# Patient Record
Sex: Male | Born: 1990 | Race: Black or African American | Hispanic: No | Marital: Single | Smoking: Never smoker
Health system: Southern US, Community
[De-identification: ages and names within clinical notes are randomized; demographics above are authoritative.]

---

## 2013-11-01 ENCOUNTER — Emergency Department (HOSPITAL_COMMUNITY): Payer: Self-pay

## 2013-11-01 ENCOUNTER — Encounter (HOSPITAL_COMMUNITY): Payer: Self-pay | Admitting: Emergency Medicine

## 2013-11-01 ENCOUNTER — Emergency Department (HOSPITAL_COMMUNITY)
Admission: EM | Admit: 2013-11-01 | Discharge: 2013-11-01 | Disposition: A | Discharge status: Home / Self Care | Payer: Self-pay | Attending: Emergency Medicine | Admitting: Emergency Medicine

## 2013-11-01 DIAGNOSIS — S46909A Unspecified injury of unspecified muscle, fascia and tendon at shoulder and upper arm level, unspecified arm, initial encounter: Secondary | ICD-10-CM | POA: Insufficient documentation

## 2013-11-01 DIAGNOSIS — S4980XA Other specified injuries of shoulder and upper arm, unspecified arm, initial encounter: Secondary | ICD-10-CM | POA: Insufficient documentation

## 2013-11-01 DIAGNOSIS — Y9361 Activity, american tackle football: Secondary | ICD-10-CM | POA: Insufficient documentation

## 2013-11-01 DIAGNOSIS — M25512 Pain in left shoulder: Secondary | ICD-10-CM

## 2013-11-01 DIAGNOSIS — X500XXA Overexertion from strenuous movement or load, initial encounter: Secondary | ICD-10-CM | POA: Insufficient documentation

## 2013-11-01 DIAGNOSIS — Y92838 Other recreation area as the place of occurrence of the external cause: Secondary | ICD-10-CM

## 2013-11-01 DIAGNOSIS — Y9239 Other specified sports and athletic area as the place of occurrence of the external cause: Secondary | ICD-10-CM | POA: Insufficient documentation

## 2013-11-01 MED ORDER — HYDROCODONE-ACETAMINOPHEN 5-325 MG PO TABS: 1.0000 | ORAL_TABLET | ORAL | Status: DC | PRN

## 2013-11-01 MED ORDER — IBUPROFEN 800 MG PO TABS: 800.0000 mg | ORAL_TABLET | Freq: Three times a day (TID) | ORAL | Status: DC

## 2013-11-01 NOTE — ED Provider Notes (Signed)
CSN: [id number]     Arrival date & time 11/01/13  1438 History  This chart was scribed for a non-physician practitioner, Melvenia Beam A. Junius Finner, working with Suzi Roots, MD by [place] Peace, ED Scribe. The patient was seen in WTR6/WTR6. The patient's care was started at 4:38 PM.    Chief Complaint  Patient presents with  . Shoulder Pain    Patient is a 23 y.o. male presenting with shoulder pain. The history is provided by the patient. No language interpreter was used.  Shoulder Pain This is a new problem. The current episode started more than 1 week ago. The problem has not changed since onset.Pertinent negatives include no chest pain, no abdominal pain, no headaches and no shortness of breath.   HPI Comments: Steve Ferrell is a 23 y.o. male who presents to the Emergency Department complaining of left shoulder pain onset 1 month ago that occurred while playing football. 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 He states pain is exacerbated with any rotation or movement of his shoulder. Pt reports that since injury he has noticed his shoulder popping in and out of place and believes he may have torn something. He reports that he has been taking Ibuprofen to address pain.    History reviewed. No pertinent past medical history. History reviewed. No pertinent past surgical history. No family history on file. History  Substance Use Topics  . Smoking status: Never Smoker   . Smokeless tobacco: Not on file  . Alcohol Use: No    Review of Systems  Constitutional: Negative for fever and chills.  Respiratory: Negative for shortness of breath.   Cardiovascular: Negative for chest pain.  Gastrointestinal: Negative for nausea, vomiting, abdominal pain and diarrhea.  Musculoskeletal:       Left shoulder pain.   Neurological: Positive for numbness. Negative for  headaches.  All other systems reviewed and are negative.     Allergies  Review of patient's allergies indicates no known allergies.  Home Medications   Prior to Admission medications   Not on File   BP 146/81  Pulse 65  Temp(Src) 97.5 F (36.4 C) (Oral)  Resp 17  SpO2 100% Physical Exam  Nursing note and vitals reviewed. Constitutional: He is oriented to person, place, and time. He appears well-developed and well-nourished. No distress.  HENT:  Head: Normocephalic and atraumatic.  Eyes: Conjunctivae and EOM are normal.  Neck: Neck supple. No tracheal deviation present.  Cardiovascular: Normal rate.   DP's intact.   Pulmonary/Chest: Effort normal. No respiratory distress.  Musculoskeletal: Normal range of motion.  No midline cervical tenderness. Tenderness of superior most left shoulder without any deformity which may be limited by body habitus. Full ROM of LUE.   Neurological: He is alert and oriented to person, place, and time.  Skin: Skin is warm and dry.  Psychiatric: He has a normal mood and affect. His behavior is normal.    ED Course  Procedures (including critical care time) Labs Review Labs Reviewed - No data to display  No results found for this or any previous visit. No results found.  Dg Shoulder Left  11/01/2013   CLINICAL DATA:  Pain.  EXAM: LEFT SHOULDER - 2+ VIEW  COMPARISON:  None.  FINDINGS: There is no evidence of fracture or dislocation. There is no evidence of arthropathy or other focal bone  abnormality. Soft tissues are unremarkable.  IMPRESSION: Negative.   Electronically Signed   By: Maisie Fus  Register   On: 11/01/2013 17:10    Imaging Review No results found.   EKG Interpretation None     Medications - No data to display  4:43 PM- Treatment plan was discussed with patient who verbalizes understanding and agrees.   MDM   Final diagnoses:  None    1. Left shoulder pain  X-ray negative for separation or other abnormality. Refer to  ortho for further management.  I personally performed the services described in this documentation, which was scribed in my presence. The recorded information has been reviewed and is accurate.     Arnoldo Hooker, PA-C 11/01/13 1746

## 2013-11-01 NOTE — Discharge Instructions (Signed)
Arthralgia °Your caregiver has diagnosed you as suffering from an arthralgia. Arthralgia means there is pain in a joint. This can come from many reasons including: °· Bruising the joint which causes soreness (inflammation) in the joint. °· Wear and tear on the joints which occur as we grow older (osteoarthritis). °· Overusing the joint. °· Various forms of arthritis. °· Infections of the joint. °Regardless of the cause of pain in your joint, most of these different pains respond to anti-inflammatory drugs and rest. The exception to this is when a joint is infected, and these cases are treated with antibiotics, if it is a bacterial infection. °HOME CARE INSTRUCTIONS  °· Rest the injured area for as long as directed by your caregiver. Then slowly start using the joint as directed by your caregiver and as the pain allows. Crutches as directed may be useful if the ankles, knees or hips are involved. If the knee was splinted or casted, continue use and care as directed. If an stretchy or elastic wrapping bandage has been applied today, it should be removed and re-applied every 3 to 4 hours. It should not be applied tightly, but firmly enough to keep swelling down. Watch toes and feet for swelling, bluish discoloration, coldness, numbness or excessive pain. If any of these problems (symptoms) occur, remove the ace bandage and re-apply more loosely. If these symptoms persist, contact your caregiver or return to this location. °· For the first 24 hours, keep the injured extremity elevated on pillows while lying down. °· Apply ice for 15-20 minutes to the sore joint every couple hours while awake for the first half day. Then 03-04 times per day for the first 48 hours. Put the ice in a plastic bag and place a towel between the bag of ice and your skin. °· Wear any splinting, casting, elastic bandage applications, or slings as instructed. °· Only take over-the-counter or prescription medicines for pain, discomfort, or fever as  directed by your caregiver. Do not use aspirin immediately after the injury unless instructed by your physician. Aspirin can cause increased bleeding and bruising of the tissues. °· If you were given crutches, continue to use them as instructed and do not resume weight bearing on the sore joint until instructed. °Persistent pain and inability to use the sore joint as directed for more than 2 to 3 days are warning signs indicating that you should see a caregiver for a follow-up visit as soon as possible. Initially, a hairline fracture (break in bone) may not be evident on X-rays. Persistent pain and swelling indicate that further evaluation, non-weight bearing or use of the joint (use of crutches or slings as instructed), or further X-rays are indicated. X-rays may sometimes not show a small fracture until a week or 10 days later. Make a follow-up appointment with your own caregiver or one to whom we have referred you. A radiologist (specialist in reading X-rays) may read your X-rays. Make sure you know how you are to obtain your X-ray results. Do not assume everything is normal if you do not hear from us. °SEEK MEDICAL CARE IF: °Bruising, swelling, or pain increases. °SEEK IMMEDIATE MEDICAL CARE IF:  °· Your fingers or toes are numb or blue. °· The pain is not responding to medications and continues to stay the same or get worse. °· The pain in your joint becomes severe. °· You develop a fever over 102° F (38.9° C). °· It becomes impossible to move or use the joint. °MAKE SURE YOU:  °·   Understand these instructions.  Will watch your condition.  Will get help right away if you are not doing well or get worse. Document Released: 02/20/2005 Document Revised: 05/15/2011 Document Reviewed: 10/09/2007 [facility] Patient Information 2015 [place], [place]. This information is not intended to replace advice given to you by your health care provider. Make sure you discuss any questions you have with your health care  provider.  Emergency Department Resource Guide 1) Find a Doctor and Pay Out of Pocket Although you won't have to find out who is covered by your insurance plan, it is a good idea to ask around and get recommendations. You will then need to call the office and see if the doctor you have chosen will accept you as a new patient and what types of options they offer for patients who are self-pay. Some doctors offer discounts or will set up payment plans for their patients who do not have insurance, but you will need to ask so you aren't surprised when you get to your appointment.  2) Contact Your Local Health Department Not all health departments have doctors that can see patients for sick visits, but many do, so it is worth a call to see if yours does. If you don't know where your local health department is, you can check in your phone book. The CDC also has a tool to help you locate your state's health department, and many state websites also have listings of all of their local health departments.  3) Find a Walk-in Clinic If your illness is not likely to be very severe or complicated, you may want to try a walk in clinic. These are popping up all over the country in pharmacies, drugstores, and shopping centers. They're usually staffed by nurse practitioners or physician assistants that have been trained to treat common illnesses and complaints. They're usually fairly quick and inexpensive. However, if you have serious medical issues or chronic medical problems, these are probably not your best option.  No Primary Care Doctor: - Call Health Connect at  [phone] - they can help you locate a primary care doctor that  accepts your insurance, provides certain services, etc. - Physician Referral Service- [phone]  Chronic Pain Problems: Organization         Address  Phone   Notes  [facility]  [phone] Patients need to be referred by their primary care doctor.   Medication  Assistance: Organization         Address  Phone   Notes  [facility] Medication [facility] [address] [place], [place] [address] [phone] --Must be a resident of [facility] -- Must have NO insurance coverage whatsoever (no Medicaid/ Medicare, etc.) -- The pt. MUST have a primary care doctor that directs their care regularly and follows them in the community   [facility]  [phone]   [employer]  [phone]    Agencies that provide inexpensive medical care: Organization         Address  Phone   Notes  [facility]  [phone]   [facility]    [phone]   [facility] [address] [place], [place] [address] [phone]   [facility] [address] [place], [place] [phone]   [facility]    [phone]   [facility]    [phone]   [facility]  [phone]  [address], [place] Phone:  [phone], Fax:  [phone] Hours of Operation:  9 am - 6 pm, M-F.  Also accepts Medicaid/Medicare and self-pay.  [facility]  [address], [place] Phone: [phone], Fax: [phone]. Hours of Operation:  8:30 am - 5:30 pm, M-F.  Also accepts Medicaid and self-pay.  [facility] [place] [address], [place] Phone: [phone]   [facility] [address] Natasha Bence [place], [place] [phone], Ext. [phone] Mondays & Thursdays: 7-9 AM.  First 15 patients are seen on a first come, first serve basis.    Medicaid-accepting [facility] Providers:  Organization         Address  Phone   Notes  [facility] [address], [place] [phone] Also accepts self-pay patients.  [facility] [address] Laurell Josephs [place], [place]  [phone]   [facility] [address], [place]  [phone]   [facility] [address], [place] [phone]   Renaye Rakers [address], [place]   [phone] Only accepts [place] patients after they have their name applied to their card.   Self-Pay (no insurance) in [facility]:  Organization         Address  Phone   Notes  Sickle Cell Patients, [facility] [address] [place], [place] [phone]   [facility] [address] [place], [place] [phone]   [facility] [place]  [address], [place] [phone]   [facility]/Dr. Osei-Bonsu  [address] [place] or [address], [place] [phone] Phone number for both [place] and [place] locations is the same.  [facility] [address] [place] [phone]   [facility] [address], [place] or [address] [phone]   [facility] [address] [place] [phone], phone; [phone], fax Sees patients 1st and 3rd Saturday of every month.  Must not qualify for public or private insurance (i.e. Medicaid, Medicare, [place] Health Choice, Veterans' Benefits)  Household income should be no more than 200% of the poverty level The clinic cannot treat you if you are pregnant or think you are pregnant  Sexually transmitted diseases are not treated at the clinic.

## 2013-11-01 NOTE — ED Notes (Signed)
Pt from home c/o L shoulder pain. Pt reports that he injured his shoulder 1 month ago playing football. Pt sts that he was blocking another player and felt burning after making contact with player. Pt reports that since, he has had "popping in and out of place and I may have torn something". Pt denies other injuries. Pt is A&O and in NAD.

## 2013-11-03 NOTE — ED Provider Notes (Signed)
Medical screening examination/treatment/procedure(s) were performed by non-physician practitioner and as supervising physician I was immediately available for consultation/collaboration.     Mireyah Chervenak E Syra Sirmons, MD 11/03/13 0719 

## 2013-12-14 ENCOUNTER — Emergency Department (HOSPITAL_COMMUNITY)
Admission: EM | Admit: 2013-12-14 | Discharge: 2013-12-14 | Disposition: A | Discharge status: Home / Self Care | Payer: Self-pay | Attending: Emergency Medicine | Admitting: Emergency Medicine

## 2013-12-14 ENCOUNTER — Emergency Department (HOSPITAL_COMMUNITY): Payer: Self-pay

## 2013-12-14 ENCOUNTER — Encounter (HOSPITAL_COMMUNITY): Payer: Self-pay | Admitting: Emergency Medicine

## 2013-12-14 DIAGNOSIS — R079 Chest pain, unspecified: Secondary | ICD-10-CM | POA: Insufficient documentation

## 2013-12-14 DIAGNOSIS — R0602 Shortness of breath: Secondary | ICD-10-CM | POA: Insufficient documentation

## 2013-12-14 DIAGNOSIS — R0989 Other specified symptoms and signs involving the circulatory and respiratory systems: Secondary | ICD-10-CM

## 2013-12-14 DIAGNOSIS — F458 Other somatoform disorders: Secondary | ICD-10-CM | POA: Insufficient documentation

## 2013-12-14 MED ORDER — DEXAMETHASONE SODIUM PHOSPHATE 10 MG/ML IJ SOLN
10.0000 mg | Freq: Once | INTRAMUSCULAR | Status: AC
  Administered 2013-12-14: 10 mg via INTRAMUSCULAR
  Filled 2013-12-14: qty 1

## 2013-12-14 NOTE — ED Notes (Signed)
Pt reports with c/o trouble swallowing. Pt says that he has felt a tightness in his throat and that these symptoms have been going on for several months now. Pt reports that it is not really pain but a tightness. NAD. Pt is talking in complete sentences.

## 2013-12-14 NOTE — Discharge Instructions (Signed)
Recommend that you followup with a gastroenterologist for further evaluation of symptoms. You may require an endoscopy or barium swallow study for further evaluation of your symptoms. Recommend you also followup with a primary care doctor to ensure no worsening of symptoms. Return to the emergency department if symptoms worsen.  Globus Syndrome Globus Syndrome is a feeling of a lump or a sensation of something caught in your throat. Eating food or drinking fluids does not seem to get rid of it. Yet it is not noticeable during the actual act of swallowing food or liquids. Usually there is nothing physically wrong. It is troublesome because it is an unpleasant sensation which is sometimes difficult to ignore and at times may seem to worsen. The syndrome is quite common. It is estimated 45% of the population experiences features of the condition at some stage during their lives. The symptoms are usually temporary. The largest group of people who feel the need to seek medical treatment is females between the ages of 4230 to 5560.  CAUSES  Globus Syndrome appears to be triggered by or aggravated by stress, anxiety and depression.  Tension related to stress could product abnormal muscle spasms in the esophagus which would account for the sensation of a lump or ball in your throat.  Frequent swallowing or drying of the throat caused by anxiety or other strong emotions can also produce this uncomfortable sensation in your throat.  Fear and sadness can be expressed by the body in many ways. For instance, if you had a relative with throat cancer you might become overly concerned about your own health and develop uncomfortable sensations in your throat.  The reaction to a crisis or a trauma event in your life can take the form of a lump in your throat. It is as if you are indirectly saying you can not handle or "swallow" one more thing. DIAGNOSIS  Usually your caregiver will know what is wrong by talking to you and  examining you. If the condition persists for several days, more testing may be done to make sure there is not another problem present. This is usually not the case. TREATMENT   Reassurance is often the best treatment available. Usually the problem leaves without treatment over several days.  Sometimes anti-anxiety medications may be prescribed.  Counseling or talk therapy can also help with strong underlying emotions.  Note that in most cases this is not something that keeps coming back and you should not be concerned or worried. Document Released: 05/13/2003 Document Revised: 05/15/2011 Document Reviewed: 10/10/2007 [facility] Patient Information 2015 [place]. This information is not intended to replace advice given to you by your health care provider. Make sure you discuss any questions you have with your health care provider.

## 2013-12-14 NOTE — ED Provider Notes (Signed)
CSN: [id number]     Arrival date & time 12/14/13  2144 History   First MD Initiated Contact with Patient 12/14/13 2155     This chart was scribed for non-physician practitioner working with Dr. Chaney Mallingavid Yao by Arlan OrganAshley Leger, ED Scribe. This patient was seen in room WTR7/WTR7 and the patient's care was started at 11:17 PM.   Chief Complaint  Patient presents with  . Dysphagia   The history is provided by the patient. No language interpreter was used.    HPI Comments: Steve Ferrell is a 23 y.o. male who presents to the Emergency Department complaining of dysphagia that has been ongoing and intermittent for 3 months. Pt describes feeling as a "tightness" in his throat and as if something is lodged in his throat. He is able to eat and drink without difficulty but states "its really hard to swallow". Pt also reports fatigue and SOB with exertion. SOB is described as "being winded". Steve Ferrell mentions intermittent new onset chest pain that he feels is not associated with recent onset of dysphagia. He denies any pain or itching/scratching to the throat at this time. No aggravating or alleviating factors. He denies any fever, chills, drooling, nausea, or vomiting. All immunizations UTD. No recent travel. No recent injury or trauma.  History reviewed. No pertinent past medical history. History reviewed. No pertinent past surgical history. No family history on file. History  Substance Use Topics  . Smoking status: Never Smoker   . Smokeless tobacco: Not on file  . Alcohol Use: No    Review of Systems  Constitutional: Negative for fever and chills.  HENT: Negative for trouble swallowing (Positive for discomfort with swallowing).   Respiratory: Positive for shortness of breath.   Cardiovascular: Positive for chest pain.  Gastrointestinal: Negative for nausea and vomiting.  All other systems reviewed and are negative.   Allergies  Review of patient's allergies indicates no known  allergies.  Home Medications   Prior to Admission medications   Not on File   Triage Vitals: BP 126/82  Pulse 65  Temp(Src) 97.9 F (36.6 C) (Oral)  Resp 20  SpO2 98%   Physical Exam  Nursing note and vitals reviewed. Constitutional: He is oriented to person, place, and time. He appears well-developed and well-nourished. No distress.  Nontoxic/nonseptic appearing  HENT:  Head: Normocephalic and atraumatic.  Mouth/Throat: Oropharynx is clear and moist. No oropharyngeal exudate.  Oropharynx clear. Uvula midline. Patient tolerating secretions without difficulty or drooling.  Eyes: Conjunctivae and EOM are normal. No scleral icterus.  Neck: Normal range of motion. Neck supple.  No stridor  Cardiovascular: Normal rate, regular rhythm and normal heart sounds.   No carotid bruits bilaterally  Pulmonary/Chest: Effort normal. No respiratory distress. He has no wheezes. He has no rales.  Chest expansion symmetric. No tachypnea or dyspnea. No retractions or accessory muscle use. Respirations even and unlabored.  Musculoskeletal: Normal range of motion.  Neurological: He is alert and oriented to person, place, and time. He exhibits normal muscle tone. Coordination normal.  Skin: Skin is warm and dry. No rash noted. He is not diaphoretic. No erythema. No pallor.  Psychiatric: He has a normal mood and affect. His behavior is normal.    ED Course  Procedures (including critical care time)  DIAGNOSTIC STUDIES: Oxygen Saturation is 98% on RA, Normal by my interpretation.    COORDINATION OF CARE: 11:17 PM-Discussed treatment plan with pt at bedside and pt agreed to plan.     Labs Review  Labs Reviewed - No data to display  Imaging Review Dg Neck Soft Tissue  12/14/2013   CLINICAL DATA:  Difficulty swallowing.  Initial encounter.  EXAM: NECK SOFT TISSUES - 1+ VIEW  COMPARISON:  None.  FINDINGS: Prevertebral soft tissues are normal. The aryepiglottic folds are normally aerated. Normal  appearance of the epiglottis and the adenoidal tissues.  Regional soft tissues appear normal. Limited visualization of lung apices is normal.  C1 to the superior endplate of C7 is imaged. Normal alignment of the cervical spine. No anterolisthesis or retrolisthesis. Cervical vertebral body heights are preserved. Intervertebral disc space heights are preserved.  IMPRESSION: Unremarkable neck soft tissue radiographs. Further evaluation could be performed with nonemergent endoscopy and/or barium swallow examination as clinically indicated   Electronically Signed   By: Simonne ComeJohn  Watts M.D.   On: 12/14/2013 22:55     EKG Interpretation None      MDM   Final diagnoses:  Globus sensation    [age] year old male presents to the emergency department for a sensation of discomfort when swallowing. He feels as though there is something stuck in his throat. He has been feeling his symptoms intermittently over the past 3 months. Patient denies weight loss or inability to eat or drink. He denies drooling as well as an inability to swallow. Patient today has a clear oropharynx. He is tolerating secretions without difficulty. Respirations even and unlabored. Patient protecting airway and in no acute respiratory distress.  Imaging obtained which shows no acute findings. Given duration of symptoms, do not believe further emergent workup is indicated; however, have discussed with patient the need for gastroenterology followup and the possibility of further testing with endoscopy or barium swallow study. Patient treated in the ED with Decadron to attempt to relieve his symptoms. He will be discharged with gastroenterology referral as well as referral to primary care. Return precautions discussed and provided. Patient agreeable to plan with no unaddressed concerns. Patient discharged in good condition  I personally performed the services described in this documentation, which was scribed in my presence. The recorded information  has been reviewed and is accurate.    Filed Vitals:   12/14/13 2151  BP: 126/82  Pulse: 65  Temp: 97.9 F (36.6 C)  TempSrc: Oral  Resp: 20  SpO2: 98%     Antony MaduraKelly Malva Diesing, PA-C 12/14/13 2320

## 2013-12-14 NOTE — ED Provider Notes (Signed)
Medical screening examination/treatment/procedure(s) were performed by non-physician practitioner and as supervising physician I was immediately available for consultation/collaboration.   EKG Interpretation None        David H Yao, MD 12/14/13 2346 

## 2016-07-23 IMAGING — CR DG SHOULDER 2+V*L*
3 series · 3 of 3 positions shown · non-contrast
Comparison: None.

CLINICAL DATA: Pain.

EXAM:
LEFT SHOULDER - 2+ VIEW

[w shoulder external left]
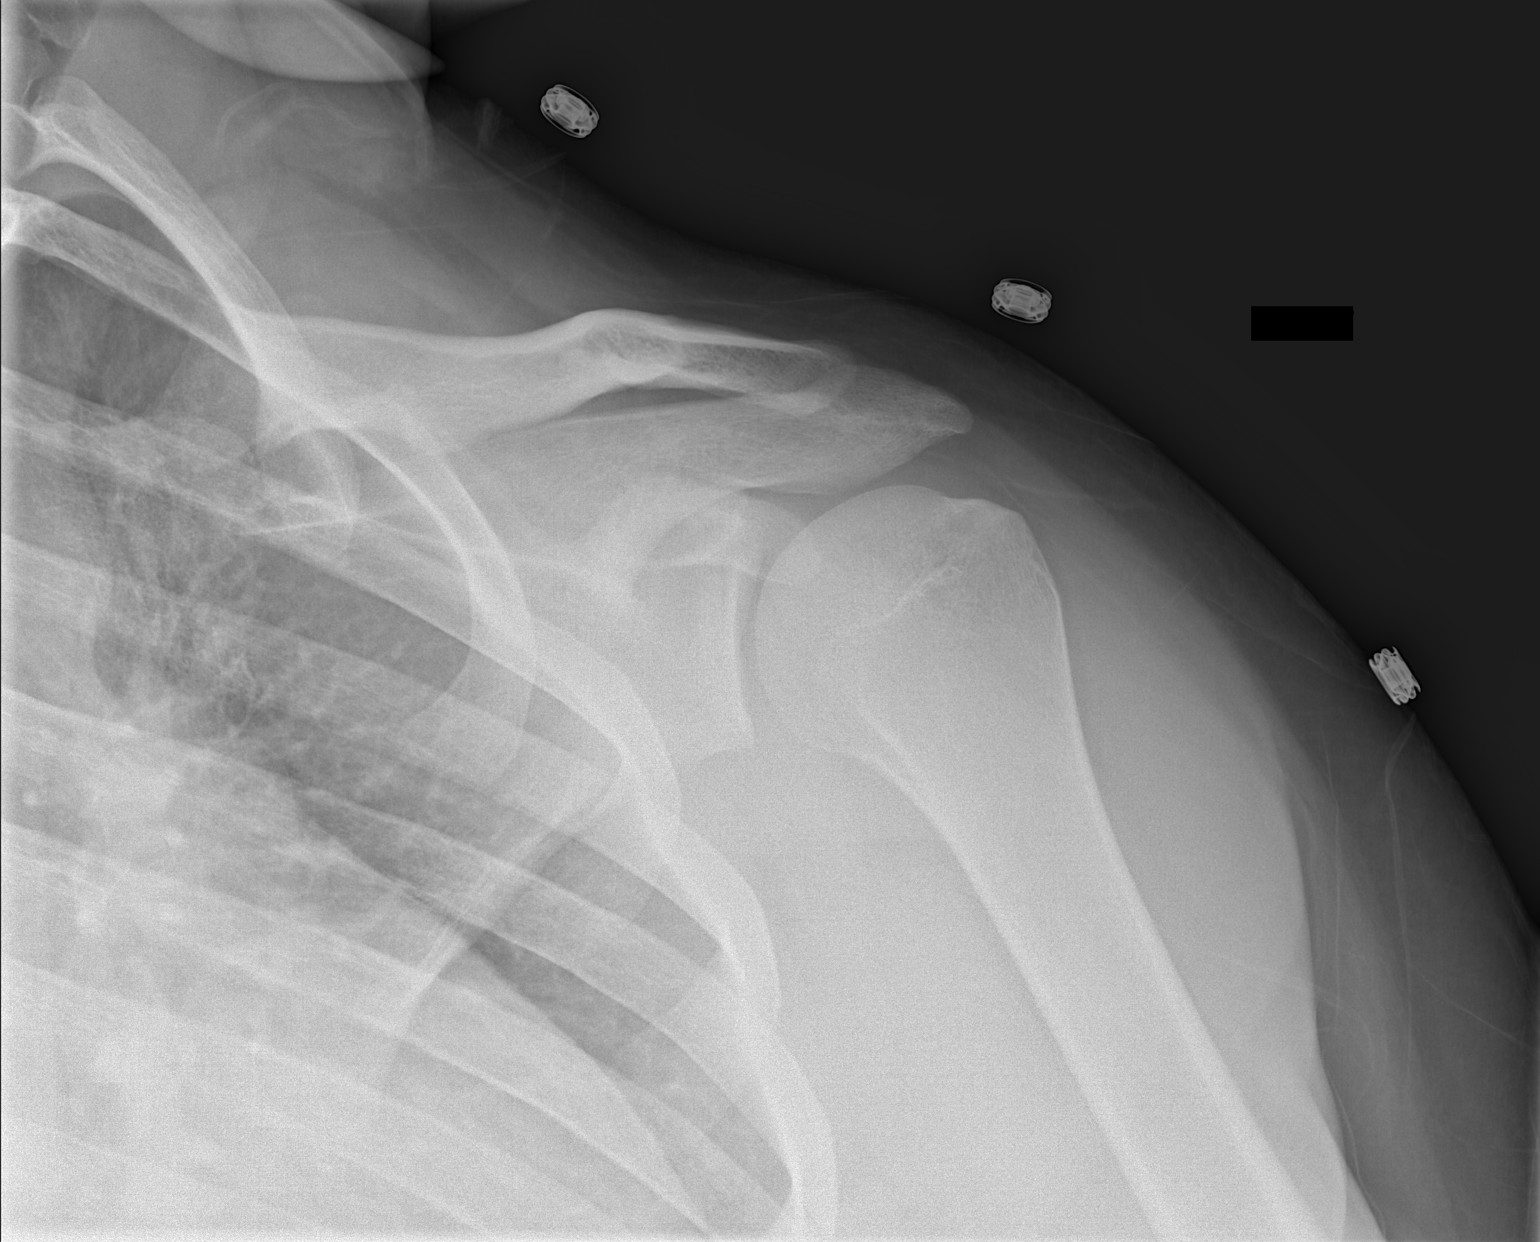

[w shoulder y-view left]
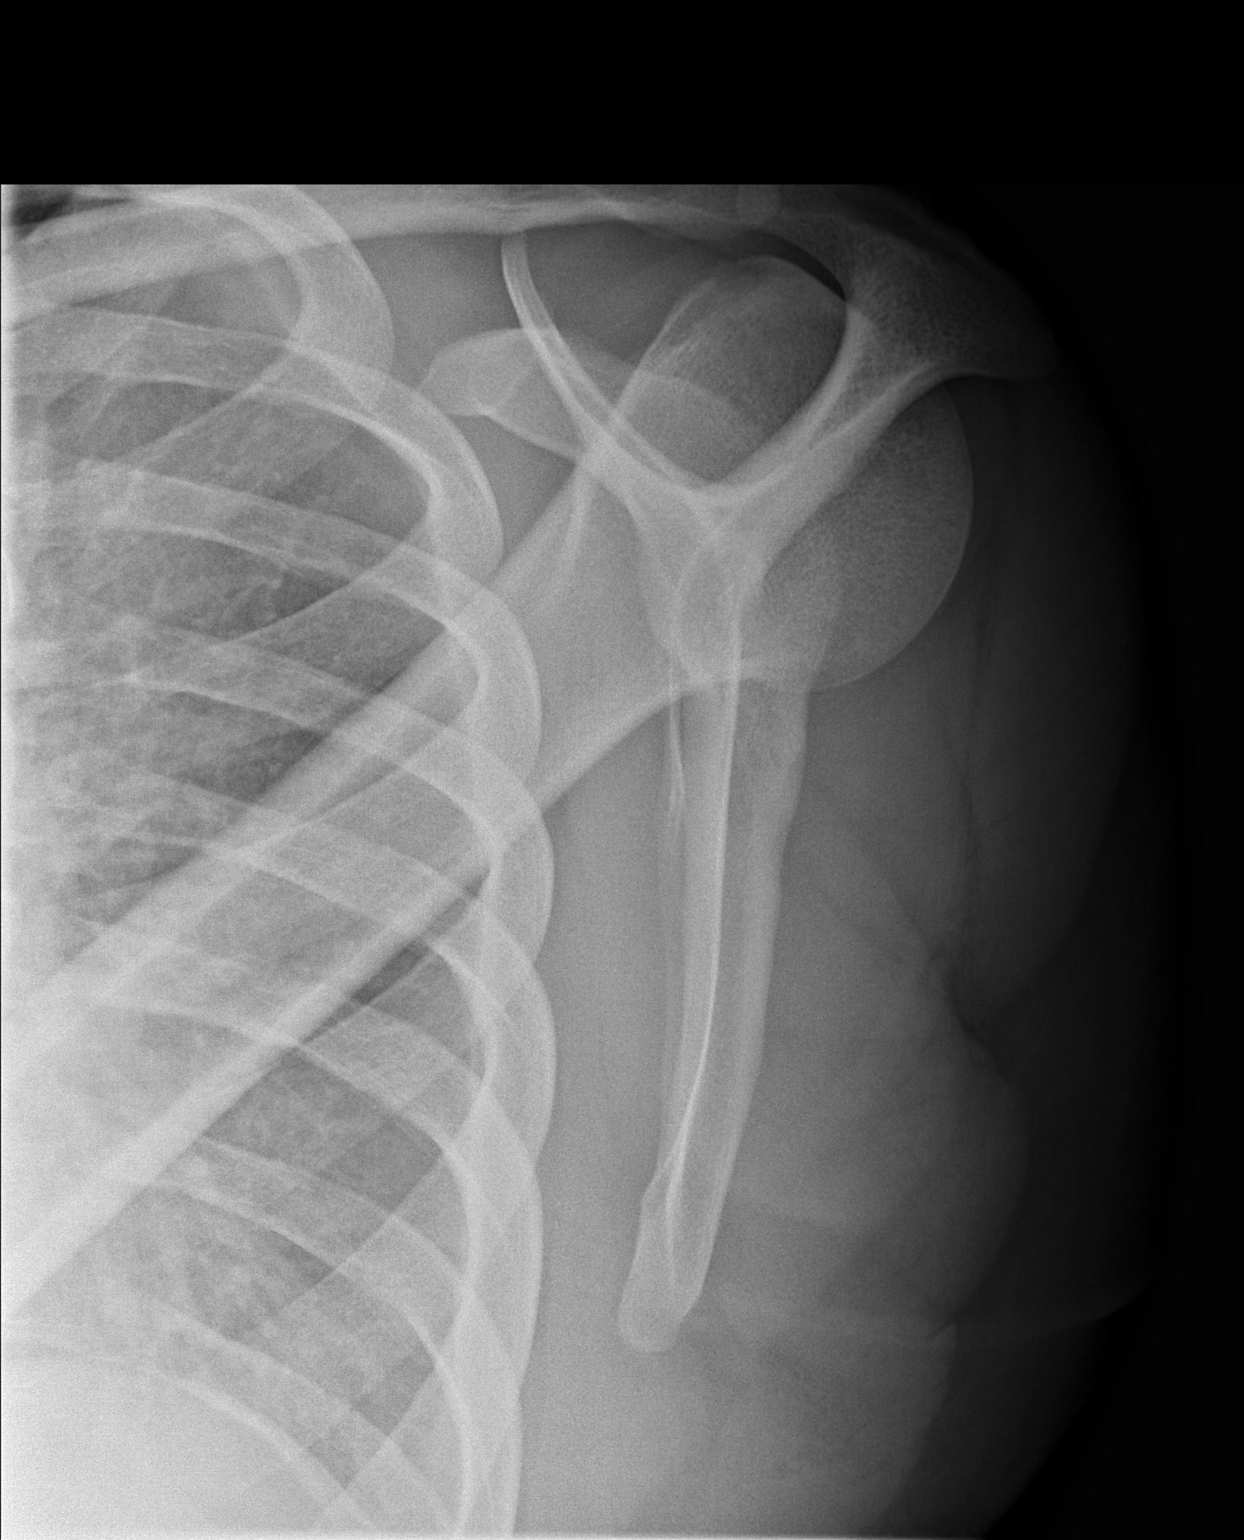

[x shoulder axillary left]
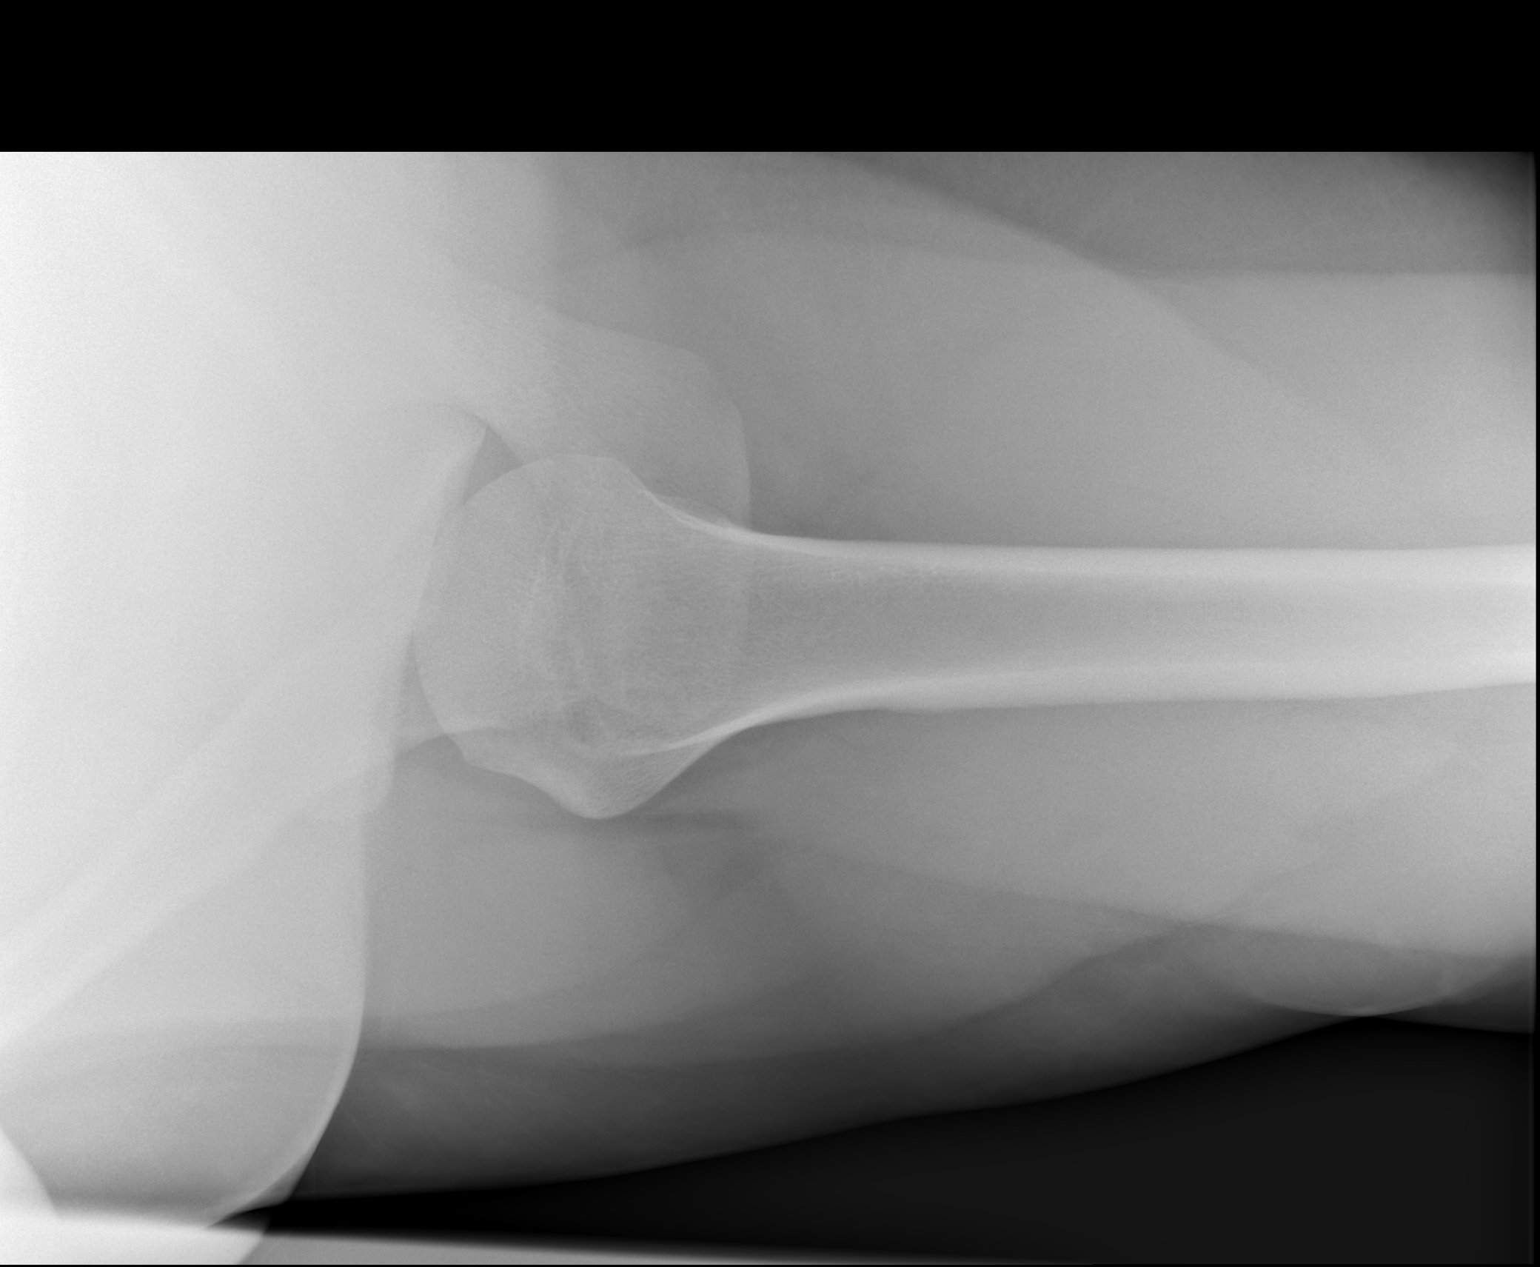

[3 of 3 positions shown; findings below may reference images not displayed]

FINDINGS: There is no evidence of fracture or dislocation. There is no
evidence of arthropathy or other focal bone abnormality. Soft
tissues are unremarkable.
IMPRESSION: Negative.
